# Patient Record
Sex: Male | Born: 1970 | Race: White | Hispanic: No | Marital: Married | Smoking: Current every day smoker
Health system: Southern US, Community
[De-identification: ages and names within clinical notes are randomized; demographics above are authoritative.]

## PROBLEM LIST (undated history)

## (undated) DIAGNOSIS — J45909 Unspecified asthma, uncomplicated: Secondary | ICD-10-CM

## (undated) HISTORY — PX: FRACTURE SURGERY: SHX138

---

## 2017-07-12 ENCOUNTER — Other Ambulatory Visit: Payer: Self-pay

## 2017-07-12 ENCOUNTER — Ambulatory Visit
Admission: EM | Admit: 2017-07-12 | Discharge: 2017-07-12 | Disposition: A | Discharge status: Home / Self Care | Payer: BLUE CROSS/BLUE SHIELD | Attending: Family Medicine | Admitting: Family Medicine

## 2017-07-12 ENCOUNTER — Encounter: Payer: Self-pay | Admitting: *Deleted

## 2017-07-12 DIAGNOSIS — R062 Wheezing: Secondary | ICD-10-CM | POA: Diagnosis not present

## 2017-07-12 DIAGNOSIS — J4520 Mild intermittent asthma, uncomplicated: Secondary | ICD-10-CM | POA: Diagnosis not present

## 2017-07-12 HISTORY — DX: Unspecified asthma, uncomplicated: J45.909

## 2017-07-12 LAB — URINALYSIS, COMPLETE (UACMP) WITH MICROSCOPIC
Bacteria, UA: NONE SEEN
Bilirubin Urine: NEGATIVE
Glucose, UA: NEGATIVE mg/dL
Hgb urine dipstick: NEGATIVE
Ketones, ur: NEGATIVE mg/dL
Leukocytes, UA: NEGATIVE
Nitrite: NEGATIVE
Protein, ur: NEGATIVE mg/dL
Specific Gravity, Urine: 1.02 (ref 1.005–1.030)
Squamous Epithelial / LPF: NONE SEEN
WBC, UA: NONE SEEN WBC/hpf (ref 0–5)
pH: 6 (ref 5.0–8.0)

## 2017-07-12 MED ORDER — ALBUTEROL SULFATE HFA 108 (90 BASE) MCG/ACT IN AERS: 1.0000 | INHALATION_SPRAY | Freq: Four times a day (QID) | RESPIRATORY_TRACT | 0 refills | Status: AC | PRN

## 2017-07-12 MED ORDER — IPRATROPIUM-ALBUTEROL 0.5-2.5 (3) MG/3ML IN SOLN
3.0000 mL | Freq: Once | RESPIRATORY_TRACT | Status: AC
  Administered 2017-07-12: 3 mL via RESPIRATORY_TRACT

## 2017-07-12 MED ORDER — PREDNISONE 20 MG PO TABS: ORAL_TABLET | ORAL | 0 refills | Status: DC

## 2017-07-12 NOTE — ED Provider Notes (Signed)
[facility]    CSN: [id number] Arrival date & time: 07/12/17  7829     History   Chief Complaint Chief Complaint  Patient presents with  . Asthma    HPI Justin Carpenter is a 47 y.o. male.   HPI  47 year old male presents with tightness and thick mucus production that has had for about a week.  He is also noticed wheezing.  History of bronchial asthma.  He has had that since childhood and has been mild and intermittent.  He has used albuterol inhaler over this last episode but he states the inhaler was 3 years of expiration.  He denies any fever or chills.  He has no known triggers that he is aware of.  He also incidentally mentions dark urine that is malodorous.  He has not had any dysuria or urgency has had nocturia over the last several days.  Denies any penile discharge.      Past Medical History:  Diagnosis Date  . Asthma     There are no active problems to display for this patient.   Past Surgical History:  Procedure Laterality Date  . FRACTURE SURGERY         Home Medications    Prior to Admission medications   Medication Sig Start Date End Date Taking? Authorizing Provider  albuterol (PROVENTIL HFA;VENTOLIN HFA) 108 (90 Base) MCG/ACT inhaler Inhale 1-2 puffs into the lungs every 6 (six) hours as needed for wheezing or shortness of breath. Use with spacer 07/12/17   Lutricia Feil, PA-C  predniSONE (DELTASONE) 20 MG tablet Take 2 tablets (40 mg) daily by mouth 07/12/17   Lutricia Feil, PA-C    Family History Family History  Problem Relation Age of Onset  . Kidney failure Mother     Social History Social History   Tobacco Use  . Smoking status: Current Every Day Smoker    Packs/day: 0.50    Types: Cigarettes  . Smokeless tobacco: Never Used  Substance Use Topics  . Alcohol use: Yes  . Drug use: Not on file     Allergies   Codeine   Review of Systems Review of Systems  Constitutional: Positive for activity change. Negative for  appetite change, chills, fatigue and fever.  HENT: Positive for congestion.   Respiratory: Positive for cough, shortness of breath and wheezing.   Genitourinary: Positive for frequency. Negative for discharge, dysuria, flank pain, hematuria, scrotal swelling and urgency.  All other systems reviewed and are negative.    Physical Exam Triage Vital Signs ED Triage Vitals  Enc Vitals Group     BP 07/12/17 1004 134/79     Pulse Rate 07/12/17 1004 71     Resp 07/12/17 1004 18     Temp 07/12/17 1004 98.2 F (36.8 C)     Temp Source 07/12/17 1004 Oral     SpO2 07/12/17 1004 99 %     Weight 07/12/17 1005 165 lb (74.8 kg)     Height 07/12/17 1005 5\' 11"  (1.803 m)     Head Circumference --      Peak Flow --      Pain Score 07/12/17 1005 0     Pain Loc --      Pain Edu? --      Excl. in GC? --    No data found.  Updated Vital Signs BP 134/79 (BP Location: Left Arm)   Pulse 71   Temp 98.2 F (36.8 C) (Oral)   Resp 18  Ht 5\' 11"  (1.803 m)   Wt 165 lb (74.8 kg)   SpO2 99%   BMI 23.01 kg/m   Visual Acuity Right Eye Distance:   Left Eye Distance:   Bilateral Distance:    Right Eye Near:   Left Eye Near:    Bilateral Near:     Physical Exam  Constitutional: He is oriented to person, place, and time. He appears well-developed and well-nourished. No distress.  HENT:  Head: Normocephalic.  Right Ear: External ear normal.  Left Ear: External ear normal.  Nose: Nose normal.  Mouth/Throat: Oropharynx is clear and moist. No oropharyngeal exudate.  Eyes: Pupils are equal, round, and reactive to light. Right eye exhibits no discharge. Left eye exhibits no discharge.  Neck: Normal range of motion.  Pulmonary/Chest: Effort normal. He has wheezes.  Musculoskeletal: Normal range of motion.  Lymphadenopathy:    He has no cervical adenopathy.  Neurological: He is alert and oriented to person, place, and time.  Skin: Skin is warm and dry. He is not diaphoretic.  Psychiatric: He has  a normal mood and affect. His behavior is normal. Judgment and thought content normal.  Nursing note and vitals reviewed.    UC Treatments / Results  Labs (all labs ordered are listed, but only abnormal results are displayed) Labs Reviewed  URINALYSIS, COMPLETE (UACMP) WITH MICROSCOPIC    EKG  EKG Interpretation None       Radiology No results found.  Procedures Procedures (including critical care time)  Medications Ordered in UC Medications  ipratropium-albuterol (DUONEB) 0.5-2.5 (3) MG/3ML nebulizer solution 3 mL (3 mLs Nebulization Given 07/12/17 1148)   The patient reports good improvement following the DuoNeb treatment.Wheezing was absent.  Initial Impression / Assessment and Plan / UC Course  I have reviewed the triage vital signs and the nursing notes.  Pertinent labs & imaging results that were available during my care of the patient were reviewed by me and considered in my medical decision making (see chart for details).     Plan: 1. Test/x-ray results and diagnosis reviewed with patient 2. rx as per orders; risks, benefits, potential side effects reviewed with patient 3. Recommend supportive treatment with use of albuterol inhaler as necessary shortness of breath or wheezing.  Placed on prednisone for exacerbation.  No antibiotics are necessary at this time.  He was reassured that his urine appears normal and the color change may be likely due to dehydration urged to drink more fluids.  Also encouraged the patient to obtain a primary care physician because of his mild intermittent asthma.  He will contact [employer] for a list of providers 4. F/u prn if symptoms worsen or don't improve   Final Clinical Impressions(s) / UC Diagnoses   Final diagnoses:  Mild intermittent asthma without complication    ED Discharge Orders        Ordered    albuterol (PROVENTIL HFA;VENTOLIN HFA) 108 (90 Base) MCG/ACT inhaler  Every 6 hours PRN    Comments:   Provide spacer and instructions to patient   07/12/17 1222    predniSONE (DELTASONE) 20 MG tablet     07/12/17 1222       Controlled Substance Prescriptions [place] Controlled Substance Registry consulted? Not Applicable   Lutricia FeilRoemer, William P, PA-C 07/12/17 1241

## 2017-07-12 NOTE — Medical Student Note (Signed)
[facility] For educational purposes for Medical, PA and NP students only and not part of the legal medical record.   CSN: [id number] Arrival date & time: 07/12/17  [id number]     History   Chief Complaint Chief Complaint  Patient presents with  . Asthma    HPI Justin Carpenter is a 47 y.o. male.  HPI  47 year old white male with a history of asthma presents complaining of shortness of breath with exertion, chest tightness, fatigue, and white clear productive cough x 2 weeks. Denies fever, unintentional weight loss, diaphoresis, chest pain, leg swelling, hemoptysis, dizziness, abdominal pain. Has an inhaler from 3 years ago that he has used sporadically, used it once this past week with temporary relief. States he gets upper respiratory infections about once a year and improves with steroids and azithromycin. No influenza vaccine, no known sick contacts. 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 No PCP.   Also complains of a "strong odor" in his urine and urinary frequency x 2 weeks. Denies dysuria, incontinence, hematuria, penile discharge/itching. No history of UTI or kidney stones. States he drinks frequently and has had to wake up in the middle of the night to urinate a couple times the past couple weeks which is unusual for him. Denies any recent dietary changes or travel.  Past Medical History:  Diagnosis Date  . Asthma     There are no active problems to display for this patient.   Past Surgical History:  Procedure Laterality Date  . FRACTURE SURGERY         Home Medications    Prior to Admission medications   Medication Sig Start Date End Date Taking? Authorizing Provider  albuterol (PROVENTIL HFA;VENTOLIN HFA) 108 (90 Base) MCG/ACT inhaler Inhale 2 puffs into the lungs every 6 (six) hours as needed for wheezing or shortness of breath.    [provider]    Family History Family History  Problem Relation Age of Onset  . Kidney  failure Mother     Social History Social History   Tobacco Use  . Smoking status: Current Every Day Smoker    Packs/day: 0.50    Types: Cigarettes  . Smokeless tobacco: Never Used  Substance Use Topics  . Alcohol use: Yes  . Drug use: Not on file     Allergies   Codeine   Review of Systems Review of Systems  Constitutional: Positive for fatigue. Negative for appetite change, chills, diaphoresis, fever and unexpected weight change.  Respiratory: Positive for cough, chest tightness and shortness of breath.   Cardiovascular: Negative for chest pain, palpitations and leg swelling.  Gastrointestinal: Negative for abdominal pain, constipation, diarrhea, nausea and vomiting.  Genitourinary: Positive for frequency. Negative for difficulty urinating, dysuria and hematuria.       Strong urinary odor  Musculoskeletal: Negative for myalgias.  Skin: Negative for rash.  Neurological: Negative for dizziness, weakness, numbness and headaches.  All other systems reviewed and are negative.    Physical Exam Updated Vital Signs BP 134/79 (BP Location: Left Arm)   Pulse 71   Temp 98.2 F (36.8 C) (Oral)   Resp 18   Ht 5\' 11"  (1.803 m)   Wt 74.8 kg   SpO2 99%   BMI 23.01 kg/m   Physical Exam  Constitutional: He appears well-developed and well-nourished.  HENT:  Head: Normocephalic and atraumatic.  Eyes: Conjunctivae are normal.  Neck: Neck supple.  Cardiovascular: Normal rate, regular rhythm and intact  distal pulses.  No murmur heard. No calf swelling or tenderness  Pulmonary/Chest: Effort normal. No stridor. No respiratory distress. He has wheezes. He has no rales. He exhibits no tenderness.  Inspires often prior to starting a new sentence. Bilateral mild inspiratory and expiratory wheezes in the apices and faint wheezing in the bases. No accessory muscle use, retractions.   Abdominal: Soft. There is no tenderness.  Musculoskeletal: He exhibits no edema.  Neurological: He is  alert.  Skin: Skin is warm and dry. Capillary refill takes less than 2 seconds.  Psychiatric: He has a normal mood and affect.  Nursing note and vitals reviewed.    ED Treatments / Results  Labs (all labs ordered are listed, but only abnormal results are displayed) Labs Reviewed  URINALYSIS, COMPLETE (UACMP) WITH MICROSCOPIC    EKG  EKG Interpretation None       Radiology No results found.  Procedures Procedures (including critical care time)  Medications Ordered in ED Medications  ipratropium-albuterol (DUONEB) 0.5-2.5 (3) MG/3ML nebulizer solution 3 mL (not administered)     Initial Impression / Assessment and Plan / ED Course  I have reviewed the triage vital signs and the nursing notes.  Pertinent labs & imaging results that were available during my care of the patient were reviewed by me and considered in my medical decision making (see chart for details).    URI with mild-intermittent asthma  47 year old afebrile, non-tachycardic male with a history of asthma presents for productive cough and shortness of breath x 2 weeks. O2 sat 100%. Received Duoneb in clinic with significant improvement in wheezing post-treatment. Refill Albuterol inhaler, prescribe 6 day steroid taper for symptomatic relief. Does not appear bacterial at this time. UA is negative and he should continued PO fluids and advised to find a PCP to manage his asthma and monitor his urinary symptoms if they do not improve. Counseled to return if he develops fever/chills, increased difficulty breathing, or symptoms worsen. Patient agreeable to plan.     Final Clinical Impressions(s) / ED Diagnoses   Final diagnoses:  None    New Prescriptions New Prescriptions   No medications on file

## 2017-07-12 NOTE — ED Triage Notes (Signed)
Patient started having symptoms of chest tightness and thicker mucus 1 week ago. Patient has a history of bronchial asthma.

## 2017-07-15 ENCOUNTER — Other Ambulatory Visit: Payer: Self-pay

## 2017-07-15 ENCOUNTER — Telehealth: Payer: Self-pay | Admitting: Emergency Medicine

## 2017-07-15 ENCOUNTER — Ambulatory Visit
Admission: EM | Admit: 2017-07-15 | Discharge: 2017-07-15 | Disposition: A | Discharge status: Home / Self Care | Payer: BLUE CROSS/BLUE SHIELD | Attending: Family Medicine | Admitting: Family Medicine

## 2017-07-15 DIAGNOSIS — J069 Acute upper respiratory infection, unspecified: Secondary | ICD-10-CM

## 2017-07-15 DIAGNOSIS — B9789 Other viral agents as the cause of diseases classified elsewhere: Secondary | ICD-10-CM | POA: Diagnosis not present

## 2017-07-15 DIAGNOSIS — R05 Cough: Secondary | ICD-10-CM | POA: Diagnosis not present

## 2017-07-15 DIAGNOSIS — J45901 Unspecified asthma with (acute) exacerbation: Secondary | ICD-10-CM

## 2017-07-15 MED ORDER — AZITHROMYCIN 250 MG PO TABS: ORAL_TABLET | ORAL | 0 refills | Status: DC

## 2017-07-15 MED ORDER — BENZONATATE 100 MG PO CAPS: 100.0000 mg | ORAL_CAPSULE | Freq: Three times a day (TID) | ORAL | 0 refills | Status: DC | PRN

## 2017-07-15 MED ORDER — PREDNISONE 20 MG PO TABS: 40.0000 mg | ORAL_TABLET | Freq: Every day | ORAL | 0 refills | Status: DC

## 2017-07-15 NOTE — Telephone Encounter (Signed)
Call[facility] to follow[facility]up with patient [facility]  Patient states that he is doing a little better but that he still feels fatigue and thinks that a Z-Pack will help him get feeling better.  Provider note states that he did not need an antibiotic at his recent visit.  Patient was advised that if he is not improving or would like to be reseen he can follow-up up here or with his PCP.  Patient states that he his new patient appointment with PCP is not until May.  Patient states that if he does not start to feel better as today progresses [facility].

## 2017-07-15 NOTE — ED Triage Notes (Signed)
Patient was seen 07/12/17 for asthma and received prednisone. Patient states he has this every year and usually gets prednisone and a zpak but was not given a zpak. Patient states he is still experiencing cough and congestion.He has had symptoms for 6 days.

## 2017-07-15 NOTE — ED Provider Notes (Signed)
[facility] ____________________________________________  Time seen: Approximately 9:00 PM  I have reviewed the triage vital signs and the nursing notes.   HISTORY  Chief Complaint Cough   HPI Manjinder Breau is a 47 y.o. male presenting for reevaluation of approximately 6 days of some nasal congestion, cough and intermittent wheezing.  Patient reports history of "bronchial asthma".  States that he was seen in urgent care 3 days ago for the same complaint was given 3-day course of prednisone.  States that he took the prednisone and feels slightly improved, but reports asthma and wheezing has continued.  States he does have a history of some what he suspects is allergies triggering his asthma in the fall, and states that he has had more generalized tired feeling with his current sickness as his typical.  States cough is occasionally productive of thick white phlegm.  Denies hemoptysis.  States nasal congestion and generalized muscle fatigue has improved.  Denies any accompanying fevers.  Reports continues to eat and drink well.  States over the last few days he has taken the prednisone only.  Has not taken any over-the-counter cough or congestion medication, allergy medicine or has been using his inhaler routinely.  States use inhaler once yesterday but none since.  Denies sensation of chest pain or shortness of breath.  Reports otherwise feels well.  Denies chest pain, shortness of breath, abdominal pain, dysuria, extremity pain, extremity swelling or rash. Denies recent sickness. Denies recent antibiotic use.  States in the past a Z-Pak with prednisone helps.  States currently establishing a PCP.  Past Medical History:  Diagnosis Date  . Asthma     There are no active problems to display for this patient.   Past Surgical History:  Procedure Laterality Date  . FRACTURE SURGERY       No current facility-administered medications for this encounter.   Current Outpatient  Medications:  .  albuterol (PROVENTIL HFA;VENTOLIN HFA) 108 (90 Base) MCG/ACT inhaler, Inhale 1-2 puffs into the lungs every 6 (six) hours as needed for wheezing or shortness of breath. Use with spacer, Disp: 1 Inhaler, Rfl: 0 .  azithromycin (ZITHROMAX Z-PAK) 250 MG tablet, Take 2 tablets (500 mg) on  Day 1,  followed by 1 tablet (250 mg) once daily on Days 2 through 5., Disp: 6 each, Rfl: 0 .  benzonatate (TESSALON PERLES) 100 MG capsule, Take 1 capsule (100 mg total) by mouth 3 (three) times daily as needed for cough., Disp: 15 capsule, Rfl: 0 .  predniSONE (DELTASONE) 20 MG tablet, Take 2 tablets (40 mg total) by mouth daily., Disp: 10 tablet, Rfl: 0  Allergies Codeine  Family History  Problem Relation Age of Onset  . Kidney failure Mother     Social History Social History   Tobacco Use  . Smoking status: Current Every Day Smoker    Packs/day: 0.50    Types: Cigarettes  . Smokeless tobacco: Never Used  Substance Use Topics  . Alcohol use: Yes  . Drug use: Not on file    Review of Systems Constitutional: No fever/chills ENT: Sometimes scratchy throat from coughing.  Denies current sore throat. Cardiovascular: Denies chest pain. Respiratory: Denies shortness of breath. Gastrointestinal: No abdominal pain.   Skin: Negative for rash.   ____________________________________________   PHYSICAL EXAM:  VITAL SIGNS: ED Triage Vitals  Enc Vitals Group     BP 07/15/17 2015 135/83     Pulse Rate 07/15/17 2015 86     Resp -- 18  Temp 07/15/17 2015 97.9 F (36.6 C)     Temp Source 07/15/17 2015 Oral     SpO2 07/15/17 2015 97 %     Weight 07/15/17 2016 165 lb (74.8 kg)     Height 07/15/17 2016 5\' 11"  (1.803 m)     Head Circumference --      Peak Flow --      Pain Score --      Pain Loc --      Pain Edu? --      Excl. in GC? --     Constitutional: Alert and oriented. Well appearing and in no acute distress. Eyes: Conjunctivae are normal.  Head: Atraumatic. No  sinus tenderness to palpation. No swelling. No erythema.  Ears: no erythema, normal TMs bilaterally.   Nose:Nasal congestion   Mouth/Throat: Mucous membranes are moist. No pharyngeal erythema. No tonsillar swelling or exudate.  Neck: No stridor.  No cervical spine tenderness to palpation. Hematological/Lymphatic/Immunilogical: No cervical lymphadenopathy. Cardiovascular: Normal rate, regular rhythm. Grossly normal heart sounds.  Good peripheral circulation. Respiratory: Normal respiratory effort.  No retractions.  Diffuse inspiratory and expiratory wheezes.  No focal area of consolidation.  No rales or rhonchi.  Speaks in complete sentences.  Occasional dry cough with bronchospasm noted.  Good air movement.  Gastrointestinal: Soft and nontender.  Musculoskeletal: Ambulatory with steady gait. No cervical, thoracic or lumbar tenderness to palpation.  Bilateral lower extremities nontender no edema noted. Neurologic:  Normal speech and language. No gait instability. Skin:  Skin appears warm, dry and intact. No rash noted. Psychiatric: Mood and affect are normal. Speech and behavior are normal. ___________________________________________   LABS (all labs ordered are listed, but only abnormal results are displayed)  Labs Reviewed - No data to display  PROCEDURES Procedures     INITIAL IMPRESSION / ASSESSMENT AND PLAN / ED COURSE  Pertinent labs & imaging results that were available during my care of the patient were reviewed by me and considered in my medical decision making (see chart for details).  Well-appearing patient.  No acute distress.  Patient admits confusion regarding to not understand he was supposed to be using his albuterol inhaler.  Will extend prednisone prescription for 5 days, directed to use albuterol inhaler 3-4 times a day for the next several days and as needed for wheezing afterwards.  Encourage to take over-the-counter antihistamine, and Rx Tessalon Perles as needed.   Discussed with patient suspect viral upper respiratory infection with asthma exacerbation.  No clear indication for antibiotic use at this time.  Discussed evaluation of chest x-ray at this time, patient declined.  Rx hardcopy given for a Z-Pak, and instructed to take if symptoms persist past 3 more days, however also discussed for reevaluation if any worsening symptoms.  Discussed strict follow-up and return parameters.Discussed indication, risks and benefits of medications with patient.  Encouraged to establish and follow-up with primary care. Discussed follow up and return parameters including no resolution or any worsening concerns. Patient verbalized understanding and agreed to plan.   ____________________________________________   FINAL CLINICAL IMPRESSION(S) / ED DIAGNOSES  Final diagnoses:  Viral URI with cough  Asthma with acute exacerbation, unspecified asthma severity, unspecified whether persistent     ED Discharge Orders        Ordered    predniSONE (DELTASONE) 20 MG tablet  Daily     07/15/17 2105    benzonatate (TESSALON PERLES) 100 MG capsule  3 times daily PRN     07/15/17 2105  azithromycin (ZITHROMAX Z-PAK) 250 MG tablet     07/15/17 2105       Note: This dictation was prepared with Dragon dictation along with smaller phrase technology. Any transcriptional errors that result from this process are unintentional.         Renford Dills, NP 07/15/17 2122

## 2017-07-15 NOTE — Discharge Instructions (Signed)
Take medication as prescribed. Rest. Drink plenty of fluids.  Use home albuterol inhaler 3-4 times a day as discussed.  Take over-the-counter antihistamine as discussed.  Follow up with your primary care physician this week as needed. Return to Urgent care for new or worsening concerns.

## 2017-07-18 ENCOUNTER — Telehealth: Payer: Self-pay

## 2017-07-18 NOTE — Telephone Encounter (Signed)
Called to follow up with patient since visit here at [facility]. Spoke with pt. Pt. Reports improvement. Patient instructed to call back with any questions or concerns. MAH  

## 2017-08-18 ENCOUNTER — Other Ambulatory Visit: Payer: Self-pay | Admitting: Internal Medicine

## 2017-08-18 DIAGNOSIS — J449 Chronic obstructive pulmonary disease, unspecified: Secondary | ICD-10-CM

## 2017-08-18 DIAGNOSIS — Z720 Tobacco use: Secondary | ICD-10-CM

## 2017-09-02 ENCOUNTER — Ambulatory Visit
Admission: RE | Admit: 2017-09-02 | Discharge: 2017-09-02 | Disposition: A | Discharge status: Home / Self Care | Payer: BLUE CROSS/BLUE SHIELD | Source: Ambulatory Visit | Attending: Internal Medicine | Admitting: Internal Medicine

## 2017-09-02 DIAGNOSIS — R918 Other nonspecific abnormal finding of lung field: Secondary | ICD-10-CM | POA: Insufficient documentation

## 2017-09-02 DIAGNOSIS — J449 Chronic obstructive pulmonary disease, unspecified: Secondary | ICD-10-CM | POA: Diagnosis not present

## 2017-09-02 DIAGNOSIS — Z720 Tobacco use: Secondary | ICD-10-CM

## 2017-09-02 MED ORDER — IOPAMIDOL (ISOVUE-300) INJECTION 61%
75.0000 mL | Freq: Once | INTRAVENOUS | Status: AC | PRN
  Administered 2017-09-02: 75 mL via INTRAVENOUS

## 2017-09-14 ENCOUNTER — Encounter: Payer: Self-pay | Admitting: Internal Medicine

## 2017-09-14 ENCOUNTER — Other Ambulatory Visit
Admission: RE | Admit: 2017-09-14 | Discharge: 2017-09-14 | Disposition: A | Discharge status: Home / Self Care | Payer: BLUE CROSS/BLUE SHIELD | Source: Ambulatory Visit | Attending: Internal Medicine | Admitting: Internal Medicine

## 2017-09-14 ENCOUNTER — Ambulatory Visit (INDEPENDENT_AMBULATORY_CARE_PROVIDER_SITE_OTHER): Payer: BLUE CROSS/BLUE SHIELD | Admitting: Internal Medicine

## 2017-09-14 VITALS — BP 130/80 | HR 80 | Resp 16 | Ht 71.0 in | Wt 175.0 lb

## 2017-09-14 DIAGNOSIS — G4719 Other hypersomnia: Secondary | ICD-10-CM | POA: Diagnosis not present

## 2017-09-14 DIAGNOSIS — F17210 Nicotine dependence, cigarettes, uncomplicated: Secondary | ICD-10-CM

## 2017-09-14 DIAGNOSIS — J449 Chronic obstructive pulmonary disease, unspecified: Secondary | ICD-10-CM | POA: Diagnosis not present

## 2017-09-14 DIAGNOSIS — J4489 Other specified chronic obstructive pulmonary disease: Secondary | ICD-10-CM

## 2017-09-14 LAB — C-REACTIVE PROTEIN: CRP: <0.8 mg/dL (ref <1.0)

## 2017-09-14 MED ORDER — MONTELUKAST SODIUM 10 MG PO TABS: 10.0000 mg | ORAL_TABLET | Freq: Every day | ORAL | 5 refills | Status: DC

## 2017-09-14 NOTE — Patient Instructions (Addendum)
Start Allegra 180 daily for allergies and omeprazole 20 mg daily for reflux.  Will prescribe singulair tablets (take singulair at night for allergies).  Start prilosec 20 mg daily.   Will send for sleep study.   Will send for blood work to look for allergies.

## 2017-09-14 NOTE — Progress Notes (Signed)
[facility]      Assessment and Plan:  Asthma, with persistent symptoms. -Continue Symbicort, Spiriva. -We will start Allegra, Singulair for suspected allergies. -We will send Rast allergy testing.  Obstructive lung disease. -Seen on pulmonary function testing with severe obstructive lung disease, with reversibility. - We will send for alpha-1 antitrypsin testing. -Continue current inhalers.  Excessive daytime sleepiness. 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 -We will send for sleep study.  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 -Acquired disease due to childhood injury. - Likely affecting lung function, with restriction seen on pulmonary function testing.  Nicotine abuse. -Discussed importance of smoke cessation, spent greater than 3 minutes in discussions. -He is currently on the patch, has decreased from 1-1/2 packs to about 8 cigarettes/day, does not think he is ready to quit completely yet.  Joint pain. -Rheumatoid factor was apparently negative, discussed that he may benefit from rheumatology referral in the future.  Orders Placed This Encounter  Procedures  . Alpha-1-antitrypsin  . Alpha-1 antitrypsin phenotype  . C-reactive protein  . Allergens w/Total IgE Area 2  . Home sleep test   Meds ordered this encounter  Medications  . montelukast (SINGULAIR) 10 MG tablet    Sig: Take 1 tablet (10 mg total) by mouth at bedtime.    Dispense:  30 tablet    Refill:  5   Return in about 6 weeks (around 10/26/2017).    Date: 09/14/2017  MRN# [id number] Justin Coonsric Novakovich 03-14-1971    Justin Carpenter is a 47 y.o. old male seen in consultation for chief complaint of:    Chief Complaint  Patient presents with  . Consult    Self referral patient c/o cough, sob. wheezing and chest tightness: pt had CT and  recent cxr.  . Fatigue    daily and body aches regularly he has lab to rule out RA.    HPI:    Patient has a diagnosis of COPD/asthma. He was recently diagnosed with a pneumonia about 2 months ago with dyspnea, daily cough, congestion, wheezing, dyspnea, chest tightness. He also some allergy symptoms, he some OTC meds but not sure what they were.  He feels that he has joint and muscle aches, his rheumatoid blood tests were negative.  He works as a Emergency planning/management officernight truck driver for [employer], he does feel sleepy during work more lately.  He actually has stopped working because he has been afraid of falling asleep at the wheel, he has never been tested for OSA.   He is on spiriva once daily, symbicort 2 puffs bid, and since he started them he no longer has to albuterol. However since he started them he is getting GERD symptoms, he wakes with sore throat. He took a course of prednisone a month ago, his joint pain went away, his breathing was better.  No pets at home.    Imaging personally reviewed, CT chest 09/02/17, in the left lung base there is some streaky basilar atelectasis with pleural thickening, pectus excavatum, with anterior heart. CXR on CD chest 07/21/17; right hilar infiltrate CBC 07/21/17, absolute eosinophil count is 330.  Outside pulmonary function test, 08/02/17.  FVC 75% predicted, FEV1 is 43% predicted, there is 12% improvement with bronchodilator.  Forced expiratory time is 13 seconds.  Flow volume loop is consistent with obstruction. Vital capacity 69% predicted, TLC 78% predicted.  DLCO 80% predicted. -Overall this test consistent with severe obstruction consistent with obstructive lung disease, there  is also restrictive component consistent with a known history of pectus excavatum.   PMHX:   Past Medical History:  Diagnosis Date  . Asthma    Surgical Hx:  Past Surgical History:  Procedure Laterality Date  . FRACTURE SURGERY     Family Hx:  Family History  Problem Relation Age of Onset  . Kidney failure Mother    Social Hx:   Social History   Tobacco Use  . Smoking  status: Current Every Day Smoker    Packs/day: 0.50    Types: Cigarettes  . Smokeless tobacco: Never Used  Substance Use Topics  . Alcohol use: Yes  . Drug use: Never   Medication:    Current Outpatient Medications:  .  albuterol (PROVENTIL HFA;VENTOLIN HFA) 108 (90 Base) MCG/ACT inhaler, Inhale 1-2 puffs into the lungs every 6 (six) hours as needed for wheezing or shortness of breath. Use with spacer, Disp: 1 Inhaler, Rfl: 0 .  budesonide-formoterol (SYMBICORT) 160-4.5 MCG/ACT inhaler, Inhale 2 puffs into the lungs 2 (two) times daily., Disp: , Rfl:  .  tiotropium (SPIRIVA HANDIHALER) 18 MCG inhalation capsule, Place 18 mcg into inhaler and inhale daily., Disp: , Rfl:    Allergies:  Codeine  Review of Systems: Gen:  Denies  fever, sweats, chills HEENT: Denies blurred vision, double vision. bleeds, sore throat Cvc:  No dizziness, chest pain. Resp:   Denies cough. Gi: Denies swallowing difficulty, stomach pain. Gu:  Denies bladder incontinence, burning urine Ext:   No Joint pain, stiffness. Skin: No skin rash,  hives  Endoc:  No polyuria, polydipsia. Psych: No depression, insomnia. Other:  All other systems were reviewed with the patient and were negative other that what is mentioned in the HPI.   Physical Examination:   VS: BP 130/80 (BP Location: Left Arm, Cuff Size: Normal)   Pulse 80   Resp 16   Ht 5\' 11"  (1.803 m)   Wt 175 lb (79.4 kg)   SpO2 100%   BMI 24.41 kg/m   General Appearance: No distress  Neuro:without focal findings,  speech normal,  HEENT: PERRLA, EOM intact.  Malimpatti 3. Pulmonary: normal breath sounds, scattered bilateral wheezing CardiovascularNormal S1,S2.  No m/r/g.   Abdomen: Benign, Soft, non-tender. Renal:  No costovertebral tenderness  GU:  No performed at this time. Endoc: No evident thyromegaly, no signs of acromegaly. Skin:   warm, no rashes, no ecchymosis  Extremities: normal, no cyanosis, clubbing.  Other findings:    LABORATORY  PANEL:   CBC No results for input(s): WBC, HGB, HCT, PLT in the last 168 hours. ------------------------------------------------------------------------------------------------------------------  Chemistries  No results for input(s): NA, K, CL, CO2, GLUCOSE, BUN, CREATININE, CALCIUM, MG, AST, ALT, ALKPHOS, BILITOT in the last 168 hours.  Invalid input(s): GFRCGP ------------------------------------------------------------------------------------------------------------------  Cardiac Enzymes No results for input(s): TROPONINI in the last 168 hours. ------------------------------------------------------------  RADIOLOGY:  No results found.     Thank  you for the consultation and for allowing [facility] to assist in the care of your patient. Our recommendations are noted above.  Please contact us if we can be of further service.   Wells Guiles, MD.  Board Certified in Internal Medicine, Pulmonary Medicine, Critical Care Medicine, and Sleep Medicine.  [facility] Office Number: [phone]  Santiago Glad, M.D.  Billy Fischer, M.D  09/14/2017

## 2017-09-15 LAB — MISC LABCORP TEST (SEND OUT): LABCORP TEST CODE: 1982

## 2017-09-15 LAB — ALPHA-1 ANTITRYPSIN PHENOTYPE: A1 ANTITRYPSIN SER: 122 mg/dL (ref 90–200)

## 2017-09-19 LAB — ALLERGENS W/TOTAL IGE AREA 2
Aspergillus Fumigatus IgE: <0.1 kU/L
Bermuda Grass IgE: <0.1 kU/L
Cladosporium Herbarum IgE: <0.1 kU/L
Cockroach, German IgE: 0.14 kU/L — AB
Cottonwood IgE: <0.1 kU/L
D001-IGE D PTERONYSSINUS: 6.53 kU/L — AB
D002-IGE D FARINAE: 5.85 kU/L — AB
Dog Dander IgE: 0.26 kU/L — AB
E001-IGE CAT DANDER: 0.48 kU/L — AB
IgE (Immunoglobulin E), Serum: 31 IU/mL (ref 6–495)
Johnson Grass IgE: <0.1 kU/L
Ragweed, Short IgE: 0.83 kU/L — AB
White Mulberry IgE: <0.1 kU/L

## 2017-09-26 ENCOUNTER — Telehealth: Payer: Self-pay | Admitting: Internal Medicine

## 2017-09-26 NOTE — Telephone Encounter (Signed)
Pt calling wanting to know about his lab results. Please advise.

## 2017-09-26 NOTE — Telephone Encounter (Signed)
Pt is asking why is he having all of the symptoms if he is only has these few allergies. He also states that his PCP told him the CXR showed COPD. Is asking why the test is negative if he has COPD.

## 2017-09-26 NOTE — Telephone Encounter (Signed)
Please call regarding lab results

## 2017-09-26 NOTE — Telephone Encounter (Signed)
Alpha-1 test (for genetic emphysema) negative.  RAST testing showed strong allergy to dust, mild allergy to dog, cat, ragweed, roach.

## 2017-09-26 NOTE — Telephone Encounter (Signed)
Even a few allergies can cause a lot of symptoms, the blood test was for a genetic form of COPD, a negative test does not mean he does not have COPD. I will discuss all the results with him at his next visit, if there are any urgent results, will notify him before that.

## 2017-09-27 NOTE — Telephone Encounter (Signed)
Pt informed of response. Nothing further needed. 

## 2017-10-03 DIAGNOSIS — M954 Acquired deformity of chest and rib: Secondary | ICD-10-CM | POA: Insufficient documentation

## 2017-10-03 DIAGNOSIS — J9811 Atelectasis: Secondary | ICD-10-CM | POA: Insufficient documentation

## 2017-10-03 DIAGNOSIS — Z720 Tobacco use: Secondary | ICD-10-CM | POA: Insufficient documentation

## 2017-10-18 DIAGNOSIS — G4733 Obstructive sleep apnea (adult) (pediatric): Secondary | ICD-10-CM | POA: Diagnosis not present

## 2017-10-19 ENCOUNTER — Telehealth: Payer: Self-pay | Admitting: *Deleted

## 2017-10-19 DIAGNOSIS — G4733 Obstructive sleep apnea (adult) (pediatric): Secondary | ICD-10-CM

## 2017-10-19 NOTE — Telephone Encounter (Signed)
Patient aware of results Orders placed Nothing further needed. 

## 2017-10-19 NOTE — Telephone Encounter (Signed)
Message to call back

## 2017-10-20 ENCOUNTER — Telehealth: Payer: Self-pay | Admitting: Internal Medicine

## 2017-10-20 NOTE — Telephone Encounter (Signed)
Contacted patient about arranging for CPAP set up.  Pt states "that he is not disputing the results but would like to know what the results are." He would also like to obtain a copy of these results. Pt is requesting a return call before he proceeds with CPAP. Rhonda J Cobb

## 2017-10-20 NOTE — Telephone Encounter (Signed)
Results of have been discussed with patient. There are no further questions and he does not want a copy of results at this time.

## 2017-10-26 ENCOUNTER — Ambulatory Visit: Payer: BLUE CROSS/BLUE SHIELD | Admitting: Internal Medicine

## 2017-11-03 ENCOUNTER — Encounter: Payer: Self-pay | Admitting: Internal Medicine

## 2017-11-03 ENCOUNTER — Ambulatory Visit (INDEPENDENT_AMBULATORY_CARE_PROVIDER_SITE_OTHER): Payer: BLUE CROSS/BLUE SHIELD | Admitting: Internal Medicine

## 2017-11-03 VITALS — BP 124/78 | HR 103 | Resp 16 | Ht 71.0 in | Wt 180.0 lb

## 2017-11-03 DIAGNOSIS — J449 Chronic obstructive pulmonary disease, unspecified: Secondary | ICD-10-CM | POA: Diagnosis not present

## 2017-11-03 NOTE — Patient Instructions (Signed)
Continue singulair, and spiriva.

## 2017-11-03 NOTE — Progress Notes (Signed)
[facility] Medicine Consultation      Assessment and Plan:  Allergic Asthma, now doing better.  -Continue Spiriva. -continue Singulair for allergies.  Chronic Obstructive lung disease, it is possible that the obstruction seen on previous PFT was due to bronchoconstriction due to recent infection.   - alpha-1 screen negative.  -Continue current inhalers.  Obstructive sleep apnea-Mild.  - At last visit the patient was noted to have fatigue, however this may have been secondary to his recent infection. Pt feels that his fatigue symptoms have resolved.  -AHI was 5, c/w mild sleep apnea.  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 -Acquired disease due to childhood injury. - Likely affecting lung function, with restriction seen on pulmonary function testing.  Nicotine abuse. -recommend smoking cessation.   Joint pain. -Rheumatoid factor was apparently negative.    Pt is going back to care of PCP, follow up with pulmonary as needed.    Date: 11/03/2017  MRN# [id number] Justin Carpenter [birth year]    Justin Carpenter is a 47 y.o. old male seen in consultation for chief complaint of:    Chief Complaint  Patient presents with  . COPD    pt here for f/u with RAST and Alpha-1 testing.  . Sleep Apnea    pt wants to discuss results of sleep study.    HPI:   Patient has a diagnosis of COPD/asthma. He is feeling much better today, he has been taking spiriva once daily, and singulair every night. His RAST test was strongly positive to dust. He is using albuterol about once per week or son.  Alpha-1 test (for genetic emphysema) negative.  RAST testing showed strong allergy to dust, mild allergy to dog, cat, ragweed, roach. IgE 31 At last visit he was noting fatigue and worried about falling asleep at the wheel. He is now feeling better. HST showed mild OSA with AHI of 5. Pt feels that test was inaccurate, asking if I can  over-ride results of test to state that he does not have OSA, otherwise wants an in-lab sleep test.   **Imaging personally reviewed, CT chest 09/02/17, in the left lung base there is some streaky basilar atelectasis with pleural thickening, pectus excavatum, with anterior heart. **CXR on CD chest 07/21/17; right hilar infiltrate **CBC 07/21/17, absolute eosinophil count is 330.  **Outside pulmonary function test, 08/02/17.  FVC 75% predicted, FEV1 is 43% predicted, there is 12% improvement with bronchodilator.  Forced expiratory time is 13 seconds.  Flow volume loop is consistent with obstruction. Vital capacity 69% predicted, TLC 78% predicted.  DLCO 80% predicted. -Overall this test consistent with severe obstruction consistent with obstructive lung disease, there is also restrictive component consistent with a known history of pectus excavatum.   PMHX:   Past Medical History:  Diagnosis Date  . Asthma    Surgical Hx:  Past Surgical History:  Procedure Laterality Date  . FRACTURE SURGERY     Family Hx:  Family History  Problem Relation Age of Onset  . Kidney failure Mother    Social Hx:   Social History   Tobacco Use  . Smoking status: Current Every Day Smoker    Packs/day: 0.50    Types: Cigarettes  . Smokeless tobacco: Never Used  Substance Use Topics  . Alcohol use: Yes  . Drug use: Never   Medication:    Current Outpatient Medications:  .  albuterol (PROVENTIL HFA;VENTOLIN HFA) 108 (90 Base) MCG/ACT inhaler,  Inhale 1-2 puffs into the lungs every 6 (six) hours as needed for wheezing or shortness of breath. Use with spacer, Disp: 1 Inhaler, Rfl: 0 .  budesonide-formoterol (SYMBICORT) 160-4.5 MCG/ACT inhaler, Inhale 2 puffs into the lungs 2 (two) times daily., Disp: , Rfl:  .  montelukast (SINGULAIR) 10 MG tablet, Take 1 tablet (10 mg total) by mouth at bedtime., Disp: 30 tablet, Rfl: 5 .  tiotropium (SPIRIVA HANDIHALER) 18 MCG inhalation capsule, Place 18 mcg into inhaler  and inhale daily., Disp: , Rfl:    Allergies:  Codeine  Review of Systems: Gen:  Denies  fever, sweats, chills HEENT: Denies blurred vision, double vision. bleeds, sore throat Cvc:  No dizziness, chest pain. Resp:   Denies cough. Gi: Denies swallowing difficulty, stomach pain. Gu:  Denies bladder incontinence, burning urine Ext:   No Joint pain, stiffness. Skin: No skin rash,  hives  Endoc:  No polyuria, polydipsia. Psych: No depression, insomnia. Other:  All other systems were reviewed with the patient and were negative other that what is mentioned in the HPI.   Physical Examination:   VS: BP 124/78 (BP Location: Left Arm, Cuff Size: Normal)   Pulse (!) 103   Resp 16   Ht  (1.803 m)   Wt 180 lb (81.6 kg)   SpO2 98%   BMI 25.10 kg/m   General Appearance: No distress  Neuro:without focal findings,  speech normal,  HEENT: PERRLA, EOM intact.  Malimpatti 3. Pulmonary: normal breath sounds, scattered bilateral wheezing CardiovascularNormal S1,S2.  No m/r/g.   Abdomen: Benign, Soft, non-tender. Renal:  No costovertebral tenderness  GU:  No performed at this time. Endoc: No evident thyromegaly, no signs of acromegaly. Skin:   warm, no rashes, no ecchymosis  Extremities: normal, no cyanosis, clubbing.  Other findings:    LABORATORY PANEL:   CBC No results for input(s): WBC, HGB, HCT, PLT in the last 168 hours. ------------------------------------------------------------------------------------------------------------------  Chemistries  No results for input(s): NA, K, CL, CO2, GLUCOSE, BUN, CREATININE, CALCIUM, MG, AST, ALT, ALKPHOS, BILITOT in the last 168 hours.  Invalid input(s): GFRCGP ------------------------------------------------------------------------------------------------------------------  Cardiac Enzymes No results for input(s): TROPONINI in the last 168 hours. ------------------------------------------------------------  RADIOLOGY:  No  results found.     Thank  you for the consultation and for allowing [facility] to assist in the care of your patient. Our recommendations are noted above.  Please contact us if we can be of further service.   Wells Guiles, MD.  Board Certified in Internal Medicine, Pulmonary Medicine, Critical Care Medicine, and Sleep Medicine.  [facility] Office Number: [phone]  Santiago Glad, M.D.  Billy Fischer, M.D  11/03/2017

## 2018-04-10 ENCOUNTER — Other Ambulatory Visit: Payer: Self-pay | Admitting: Internal Medicine

## 2019-01-24 IMAGING — CT CT CHEST W/ CM
2 of 4 series · 15 of 36 positions shown, 18 images · IV contrast (iopamidol)
Comparison: None.

CLINICAL DATA: Worsening fatigue.

EXAM:
CT CHEST WITH CONTRAST
TECHNIQUE: Multidetector CT imaging of the chest was performed during
intravenous contrast administration.
CONTRAST:  75mL [id number] IOPAMIDOL ([id number]) INJECTION 61%

[Series 2: axial chest · axial · 0.68mm/px · z∈[-1282,-942]mm · 12 of 202 slices shown, 15 images]
[im 16/202  mediastinal]
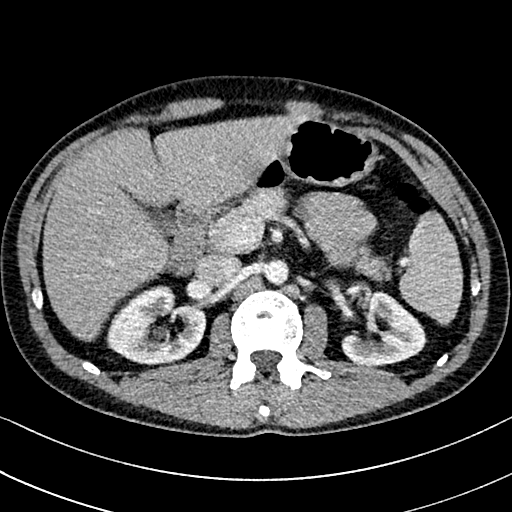
[im 16/202  lung]
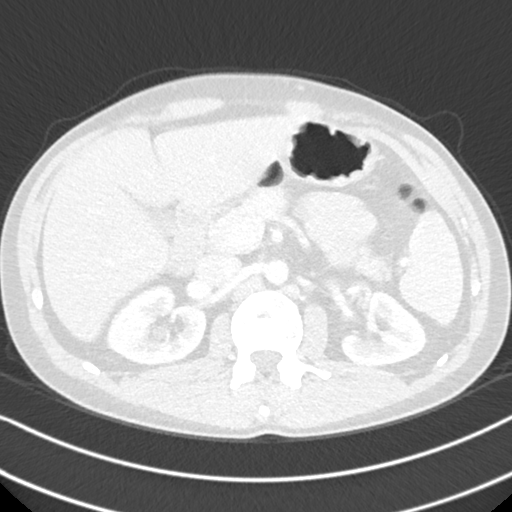
[im 31/202  lung]
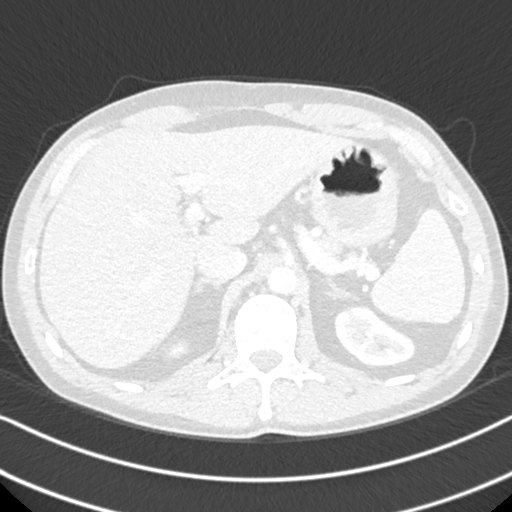
[im 47/202  lung]
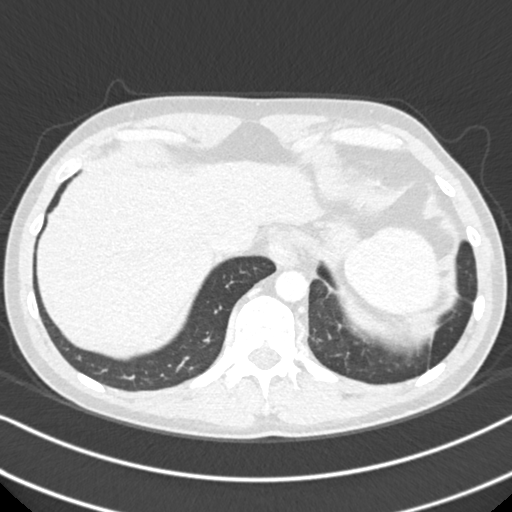
[im 62/202  lung]
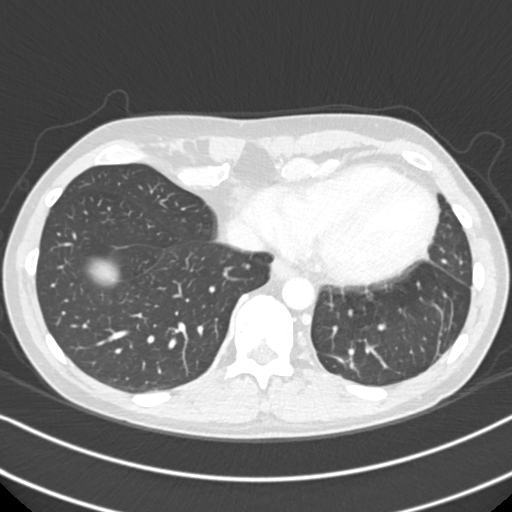
[im 78/202  mediastinal]
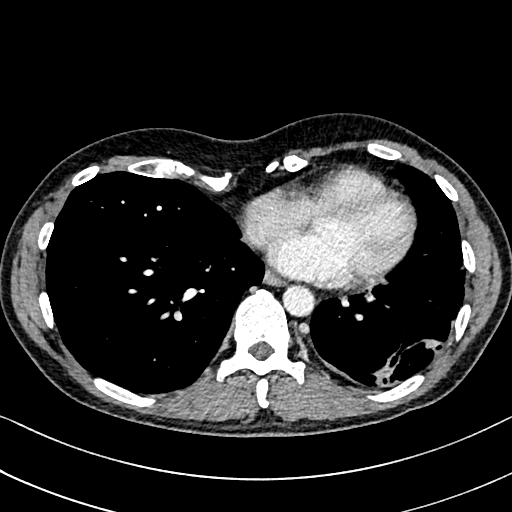
[im 78/202  lung]
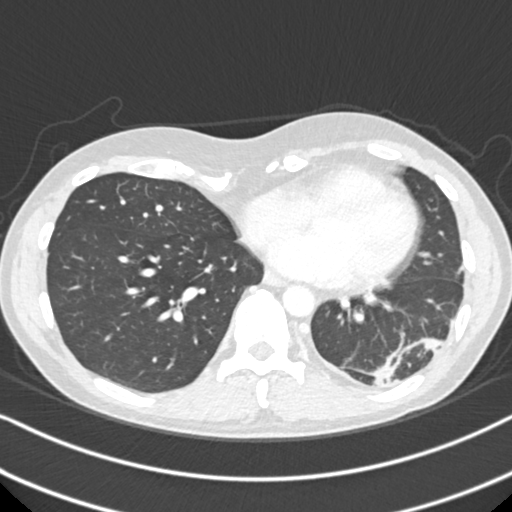
[im 93/202  lung]
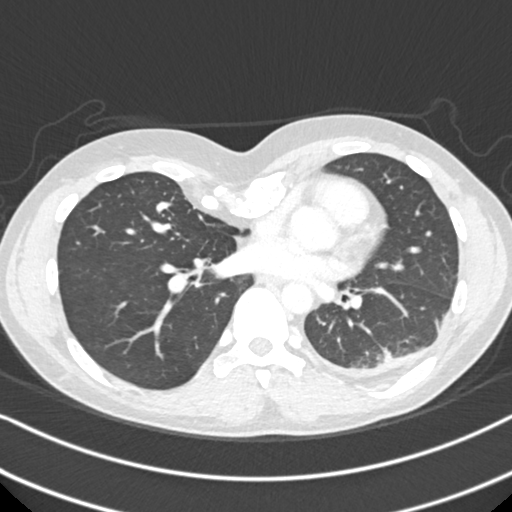
[im 109/202  lung]
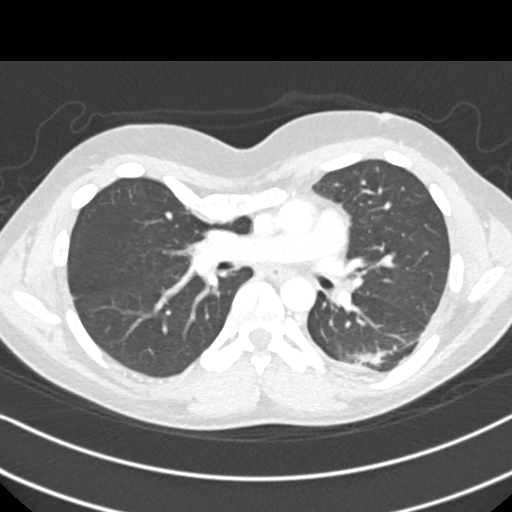
[im 124/202  lung]
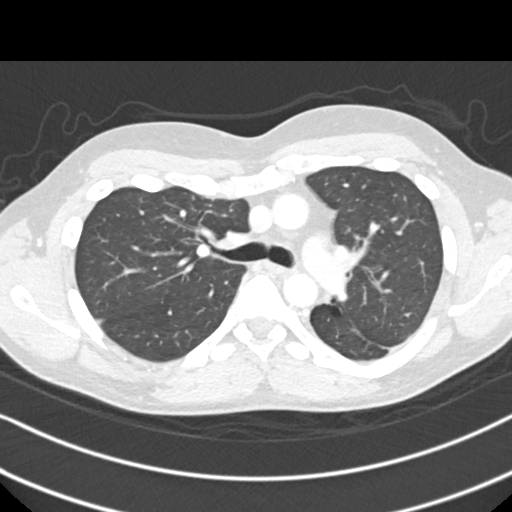
[im 140/202  mediastinal]
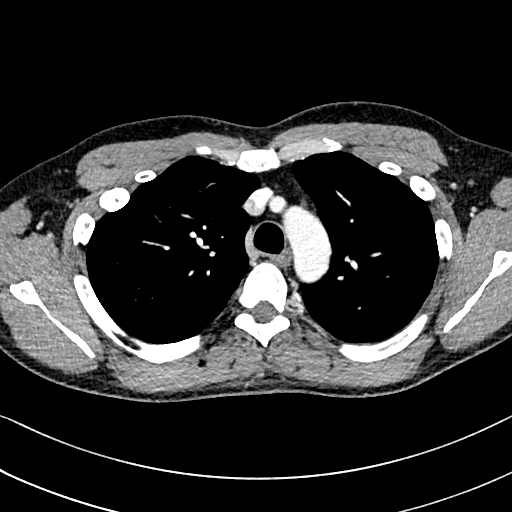
[im 140/202  lung]
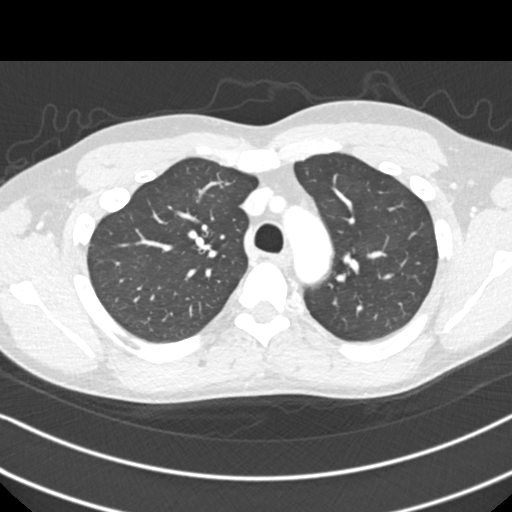
[im 155/202  lung]
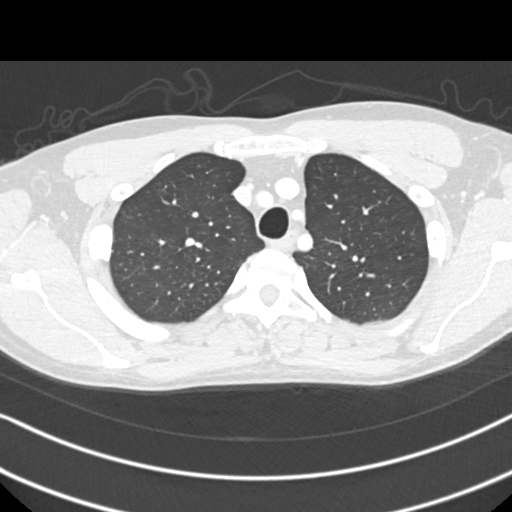
[im 171/202  lung]
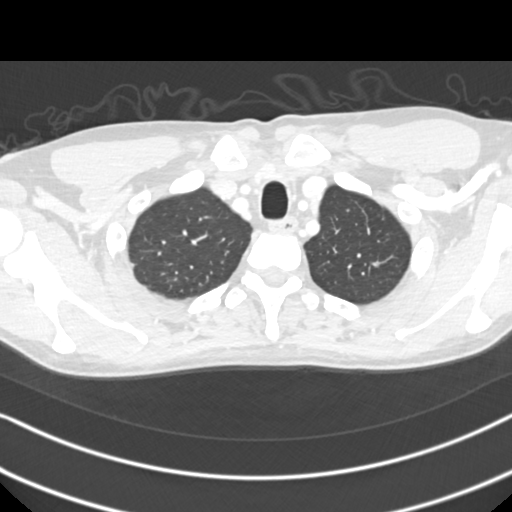
[im 186/202  lung]
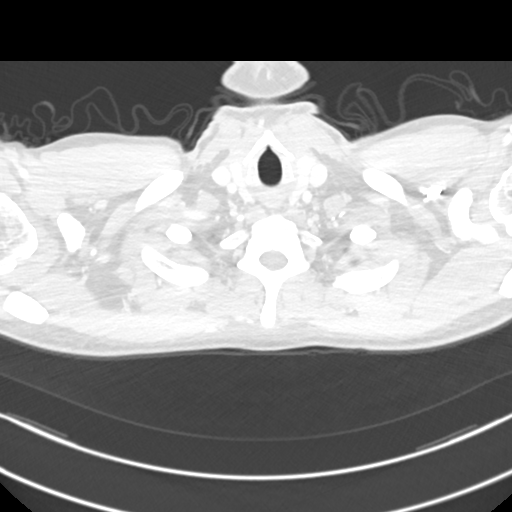

[Series 4: coronal chest · coronal · 0.68mm/px · 3 of 125 slices shown]
[im 25/125  lung]
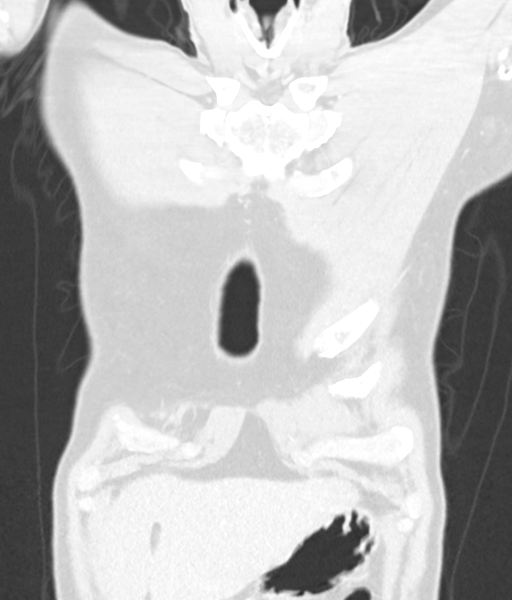
[im 50/125  lung]
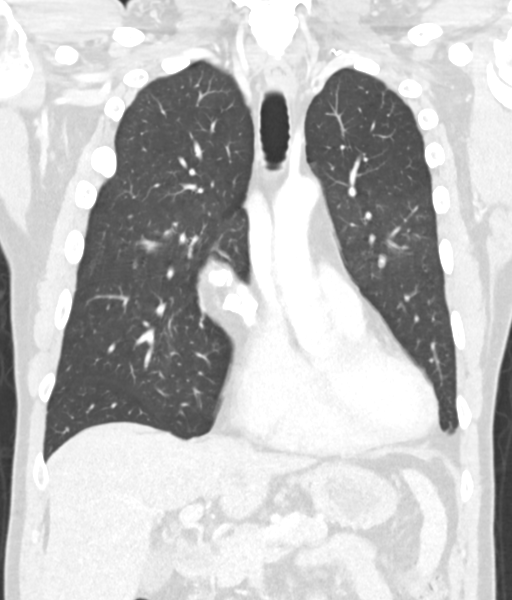
[im 75/125  lung]
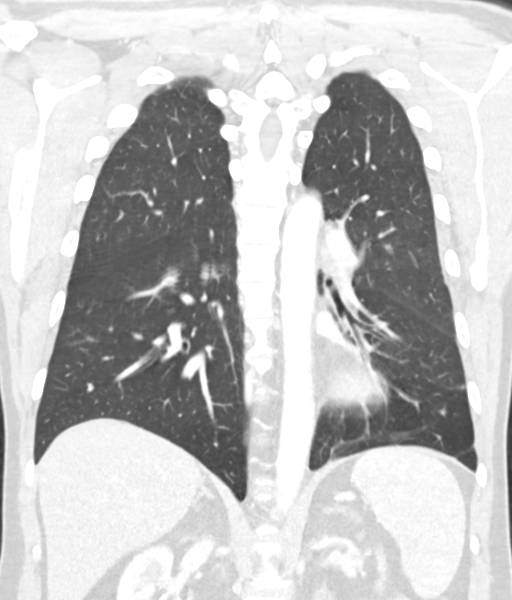

[15 of 36 positions shown; findings below may reference images not displayed]

FINDINGS: Cardiovascular: The heart size is normal. No pericardial effusion.
No thoracic aortic aneurysm.

Mediastinum/Nodes: No mediastinal lymphadenopathy. There is no hilar
lymphadenopathy. The esophagus has normal imaging features. There is
no axillary lymphadenopathy.

Lungs/Pleura: Right lung is clear without focal airspace
consolidation or suspicious pulmonary nodule/mass. Volume loss noted
left lower lobe with posterior bandlike curvilinear opacity showing
retraction of vascularity into the abnormality and associated
posterior chronic pleural thickening. Imaging features suggest
rounded atelectasis/scarring.

Upper Abdomen: 10 mm hypoattenuating lesion in the dome of the liver
cannot be fully characterized but is likely a cyst.

Musculoskeletal: Bone windows reveal no worrisome lytic or sclerotic
osseous lesions. Pectus excavatum deformity noted.
IMPRESSION: Volume loss in the left lower lobe with associated curvilinear
bandlike opacity suggesting scarring or atypical appearance of
rounded atelectasis. Adjacent pleural thickening suggests chronicity
and would be in keeping with rounded atelectasis. Follow-up CT scan
in 3-6 months could be used to ensure stability.

Otherwise unremarkable exam.

## 2020-02-20 ENCOUNTER — Other Ambulatory Visit: Payer: Self-pay | Admitting: Internal Medicine

## 2020-02-20 ENCOUNTER — Other Ambulatory Visit (HOSPITAL_COMMUNITY): Payer: Self-pay | Admitting: Internal Medicine

## 2020-02-20 DIAGNOSIS — J9811 Atelectasis: Secondary | ICD-10-CM

## 2020-02-20 DIAGNOSIS — J4541 Moderate persistent asthma with (acute) exacerbation: Secondary | ICD-10-CM

## 2020-02-20 DIAGNOSIS — J849 Interstitial pulmonary disease, unspecified: Secondary | ICD-10-CM

## 2020-02-20 DIAGNOSIS — J410 Simple chronic bronchitis: Secondary | ICD-10-CM

## 2022-05-07 ENCOUNTER — Other Ambulatory Visit: Payer: Self-pay | Admitting: Physician Assistant

## 2022-05-07 DIAGNOSIS — R9389 Abnormal findings on diagnostic imaging of other specified body structures: Secondary | ICD-10-CM

## 2022-05-19 ENCOUNTER — Other Ambulatory Visit (HOSPITAL_BASED_OUTPATIENT_CLINIC_OR_DEPARTMENT_OTHER): Payer: Self-pay

## 2022-05-19 DIAGNOSIS — J45909 Unspecified asthma, uncomplicated: Secondary | ICD-10-CM

## 2022-05-20 ENCOUNTER — Encounter (HOSPITAL_BASED_OUTPATIENT_CLINIC_OR_DEPARTMENT_OTHER): Payer: BLUE CROSS/BLUE SHIELD

## 2022-05-20 ENCOUNTER — Institutional Professional Consult (permissible substitution) (HOSPITAL_BASED_OUTPATIENT_CLINIC_OR_DEPARTMENT_OTHER): Payer: BLUE CROSS/BLUE SHIELD | Admitting: Pulmonary Disease

## 2022-05-24 ENCOUNTER — Encounter (HOSPITAL_BASED_OUTPATIENT_CLINIC_OR_DEPARTMENT_OTHER): Payer: Self-pay
